# Patient Record
Sex: Male | Born: 1995
Health system: Southern US, Community
[De-identification: ages and names within clinical notes are randomized; demographics above are authoritative.]

## PROBLEM LIST (undated history)

## (undated) DIAGNOSIS — Z889 Allergy status to unspecified drugs, medicaments and biological substances status: Secondary | ICD-10-CM

## (undated) DIAGNOSIS — Z86711 Personal history of pulmonary embolism: Secondary | ICD-10-CM

## (undated) DIAGNOSIS — I38 Endocarditis, valve unspecified: Secondary | ICD-10-CM

## (undated) DIAGNOSIS — F1990 Other psychoactive substance use, unspecified, uncomplicated: Secondary | ICD-10-CM

## (undated) HISTORY — PX: TRICUSPID VALVE REPLACEMENT: SHX816

## (undated) HISTORY — PX: FOOT SURGERY: SHX648

---
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
## 2016-01-04 ENCOUNTER — Emergency Department (HOSPITAL_BASED_OUTPATIENT_CLINIC_OR_DEPARTMENT_OTHER)
Admission: EM | Admit: 2016-01-04 | Discharge: 2016-01-04 | Disposition: A | Discharge status: Home / Self Care | Payer: Self-pay | Attending: Emergency Medicine | Admitting: Emergency Medicine

## 2016-01-04 ENCOUNTER — Encounter (HOSPITAL_BASED_OUTPATIENT_CLINIC_OR_DEPARTMENT_OTHER): Payer: Self-pay | Admitting: *Deleted

## 2016-01-04 ENCOUNTER — Emergency Department (HOSPITAL_BASED_OUTPATIENT_CLINIC_OR_DEPARTMENT_OTHER): Payer: Self-pay

## 2016-01-04 DIAGNOSIS — Y9389 Activity, other specified: Secondary | ICD-10-CM | POA: Insufficient documentation

## 2016-01-04 DIAGNOSIS — S8992XA Unspecified injury of left lower leg, initial encounter: Secondary | ICD-10-CM | POA: Insufficient documentation

## 2016-01-04 DIAGNOSIS — W010XXA Fall on same level from slipping, tripping and stumbling without subsequent striking against object, initial encounter: Secondary | ICD-10-CM | POA: Insufficient documentation

## 2016-01-04 DIAGNOSIS — Y9289 Other specified places as the place of occurrence of the external cause: Secondary | ICD-10-CM | POA: Insufficient documentation

## 2016-01-04 DIAGNOSIS — F17210 Nicotine dependence, cigarettes, uncomplicated: Secondary | ICD-10-CM | POA: Insufficient documentation

## 2016-01-04 DIAGNOSIS — Y998 Other external cause status: Secondary | ICD-10-CM | POA: Insufficient documentation

## 2016-01-04 DIAGNOSIS — M25562 Pain in left knee: Secondary | ICD-10-CM

## 2016-01-04 HISTORY — DX: Allergy status to unspecified drugs, medicaments and biological substances: Z88.9

## 2016-01-04 MED ORDER — IBUPROFEN 600 MG PO TABS: 600.0000 mg | ORAL_TABLET | Freq: Four times a day (QID) | ORAL | Status: DC | PRN

## 2016-01-04 MED ORDER — IBUPROFEN 400 MG PO TABS
600.0000 mg | ORAL_TABLET | Freq: Once | ORAL | Status: AC
  Administered 2016-01-04: 600 mg via ORAL
  Filled 2016-01-04: qty 1

## 2016-01-04 MED ORDER — HYDROCODONE-ACETAMINOPHEN 5-325 MG PO TABS: 2.0000 | ORAL_TABLET | Freq: Once | ORAL | Status: DC

## 2016-01-04 MED ORDER — OXYCODONE-ACETAMINOPHEN 5-325 MG PO TABS
1.0000 | ORAL_TABLET | Freq: Once | ORAL | Status: AC
  Administered 2016-01-04: 1 via ORAL
  Filled 2016-01-04: qty 1

## 2016-01-04 MED ORDER — HYDROCODONE-ACETAMINOPHEN 5-325 MG PO TABS: 2.0000 | ORAL_TABLET | ORAL | Status: DC | PRN

## 2016-01-04 NOTE — ED Notes (Signed)
Left knee pain, tripped and fell yesterday and landed on brick- Ambulates with pain

## 2016-01-04 NOTE — ED Notes (Signed)
Patient transported to X-ray 

## 2016-01-04 NOTE — ED Notes (Signed)
Family at bedside. 

## 2016-01-04 NOTE — Discharge Instructions (Signed)
Knee Pain Follow up with orthopedics if you continue to have pain after one week. Take ibuprofen every 6 hours as needed for pain. Take Norco for breakthrough pain. Knee pain is a common problem. It can have many causes. The pain often goes away by following your doctor's home care instructions. Treatment for ongoing pain will depend on the cause of your pain. If your knee pain continues, more tests may be needed to diagnose your condition. Tests may include X-rays or other imaging studies of your knee. HOME CARE  Take medicines only as told by your doctor.  Rest your knee and keep it raised (elevated) while you are resting.  Do not do things that cause pain or make your pain worse.  Avoid activities where both feet leave the ground at the same time, such as running, jumping rope, or doing jumping jacks.  Apply ice to the knee area:  Put ice in a plastic bag.  Place a towel between your skin and the bag.  Leave the ice on for 20 minutes, 2-3 times a day.  Ask your doctor if you should wear an elastic knee support.  Sleep with a pillow under your knee.  Lose weight if you are overweight. Being overweight can make your knee hurt more.  Do not use any tobacco products, including cigarettes, chewing tobacco, or electronic cigarettes. If you need help quitting, ask your doctor. Smoking may slow the healing of any bone and joint problems that you may have. GET HELP IF:  Your knee pain does not stop, it changes, or it gets worse.  You have a fever along with knee pain.  Your knee gives out or locks up.  Your knee becomes more swollen. GET HELP RIGHT AWAY IF:   Your knee feels hot to the touch.  You have chest pain or trouble breathing.   This information is not intended to replace advice given to you by your health care provider. Make sure you discuss any questions you have with your health care provider.   Document Released: 01/28/2009 Document Revised: 11/22/2014 Document  Reviewed: 01/02/2014 Elsevier Interactive Patient Education [employer]

## 2016-01-04 NOTE — ED Provider Notes (Signed)
CSN: [id number]     Arrival date & time 01/04/16  1043 History   First MD Initiated Contact with Patient 01/04/16 1156     Chief Complaint  Patient presents with  . Knee Pain   (Consider location/radiation/quality/duration/timing/severity/associated sxs/prior Treatment) Patient is a 20 y.o. male presenting with knee pain. The history is provided by the patient. No language interpreter was used.  Knee Pain   Mr. Ralph Harris is a 20 y.o male with no significant past medical history who presents for left knee pain after tripping and falling yesterday. She states she landed on a brick. She was able to ambulate afterwards. He reports not being able to sleep last night after taking 2 ibuprofen. States the pain has worsened and is throbbing. Denies any numbness or tingling. Denies any head injury or loss of consciousness.  Past Medical History  Diagnosis Date  . Multiple allergies    Past Surgical History  Procedure Laterality Date  . Foot surgery     No family history on file. Social History  Substance Use Topics  . Smoking status: Current Every Day Smoker    Types: Cigarettes  . Smokeless tobacco: Former Neurosurgeon  . Alcohol Use: No    Review of Systems  Musculoskeletal: Positive for myalgias, arthralgias and gait problem.  Skin: Negative for color change and wound.  Neurological: Negative for weakness and numbness.      Allergies  Review of patient's allergies indicates no known allergies.  Home Medications   Prior to Admission medications   Medication Sig Start Date End Date Taking? Authorizing Provider  HYDROcodone-acetaminophen (NORCO/VICODIN) 5-325 MG tablet Take 2 tablets by mouth every 4 (four) hours as needed. 01/04/16   Hagen Bohorquez Patel-Mills, PA-C  ibuprofen (ADVIL,MOTRIN) 600 MG tablet Take 1 tablet (600 mg total) by mouth every 6 (six) hours as needed. 01/04/16   Rheya Minogue Patel-Mills, PA-C  phenol (CHLORASEPTIC) 1.4 % LIQD Use as directed 1 spray in the mouth or throat as needed for  throat irritation / pain. 02/04/15   Robyn M Hess, PA-C   BP 124/76 mmHg  Pulse 60  Temp(Src) 98.2 F (36.8 C) (Oral)  Resp 16  Wt 69.4 kg  SpO2 100% Physical Exam  Constitutional: He is oriented to person, place, and time. He appears well-developed and well-nourished.  HENT:  Head: Normocephalic and atraumatic.  Eyes: Conjunctivae are normal.  Neck: Normal range of motion. Neck supple.  Cardiovascular: Normal rate.   Pulmonary/Chest: Effort normal.  Musculoskeletal:  Left knee: Tenderness to the medial aspect of the knee. Able to flex to 45. Able to straight leg raise. 2+ DP pulse. No joint effusion. Mild erythema in the medial aspect of the knee. No posterior knee pain. No tenderness of the patella or fibular head. No tibial tuberosity pain.  Neurological: He is alert and oriented to person, place, and time.  Skin: Skin is warm and dry.  Nursing note and vitals reviewed.   ED Course  Procedures (including critical care time) Labs Review Labs Reviewed - No data to display  Imaging Review Dg Knee Complete 4 Views Left  01/04/2016  CLINICAL DATA:  Fall yesterday EXAM: LEFT KNEE - COMPLETE 4+ VIEW COMPARISON:  None. FINDINGS: There is no evidence of fracture, dislocation, or joint effusion. There is no evidence of arthropathy or other focal bone abnormality. Soft tissues are unremarkable. IMPRESSION: Negative. Electronically Signed   By: Jolaine Click M.D.   On: 01/04/2016 11:38   I have personally reviewed and evaluated these image results  as part of my medical decision-making.   EKG Interpretation None      MDM   Final diagnoses:  Knee pain, acute, left   Patient presents for left knee pain that started after trip and fall yesterday and landing on a brick. No signs of infection or joint effusion. Patient is able to walk and straight leg raise. Vitals stable. Patient was put in a sling. I discussed following up with orthopedics if he continued to have pain after one week.  He can take ibuprofen or Tylenol for pain. He was given a few Norco. Patient agrees the plan. Medications  oxyCODONE-acetaminophen (PERCOCET/ROXICET) 5-325 MG per tablet 1 tablet (1 tablet Oral Given 01/04/16 1141)  ibuprofen (ADVIL,MOTRIN) tablet 600 mg (600 mg Oral Given 01/04/16 1238)   Filed Vitals:   01/04/16 1108 01/04/16 1231  BP: 140/81 124/76  Pulse: 79 60  Temp: 98.2 F (36.8 C)   Resp: 18 [address] PA-C 01/04/16 1528  Lyndal Pulley, MD 01/05/16 1330

## 2016-08-21 ENCOUNTER — Emergency Department (HOSPITAL_BASED_OUTPATIENT_CLINIC_OR_DEPARTMENT_OTHER)
Admission: EM | Admit: 2016-08-21 | Discharge: 2016-08-21 | Disposition: A | Discharge status: Home / Self Care | Payer: Medicaid Other | Attending: Emergency Medicine | Admitting: Emergency Medicine

## 2016-08-21 ENCOUNTER — Encounter (HOSPITAL_BASED_OUTPATIENT_CLINIC_OR_DEPARTMENT_OTHER): Payer: Self-pay | Admitting: *Deleted

## 2016-08-21 DIAGNOSIS — F17210 Nicotine dependence, cigarettes, uncomplicated: Secondary | ICD-10-CM | POA: Insufficient documentation

## 2016-08-21 DIAGNOSIS — M791 Myalgia, unspecified site: Secondary | ICD-10-CM

## 2016-08-21 DIAGNOSIS — R748 Abnormal levels of other serum enzymes: Secondary | ICD-10-CM | POA: Insufficient documentation

## 2016-08-21 DIAGNOSIS — Z79899 Other long term (current) drug therapy: Secondary | ICD-10-CM | POA: Insufficient documentation

## 2016-08-21 LAB — CBC WITH DIFFERENTIAL/PLATELET
Basophils Absolute: 0 10*3/uL (ref 0.0–0.1)
Basophils Relative: 0 %
Eosinophils Absolute: 0.1 10*3/uL (ref 0.0–0.7)
Eosinophils Relative: 2 %
HCT: 50.6 % (ref 39.0–52.0)
Hemoglobin: 15.8 g/dL (ref 13.0–17.0)
Lymphocytes Relative: 25 %
Lymphs Abs: 1.6 10*3/uL (ref 0.7–4.0)
MCH: 30.4 pg (ref 26.0–34.0)
MCHC: 31.2 g/dL (ref 30.0–36.0)
MCV: 97.3 fL (ref 78.0–100.0)
Monocytes Absolute: 0.8 10*3/uL (ref 0.1–1.0)
Monocytes Relative: 14 %
Neutro Abs: 3.6 10*3/uL (ref 1.7–7.7)
Neutrophils Relative %: 59 %
Platelets: 159 10*3/uL (ref 150–400)
RBC: 5.2 MIL/uL (ref 4.22–5.81)
RDW: 13.2 % (ref 11.5–15.5)
WBC: 6.2 10*3/uL (ref 4.0–10.5)

## 2016-08-21 LAB — URINALYSIS, ROUTINE W REFLEX MICROSCOPIC
Bilirubin Urine: NEGATIVE
Glucose, UA: NEGATIVE mg/dL
Hgb urine dipstick: NEGATIVE
Ketones, ur: NEGATIVE mg/dL
Leukocytes, UA: NEGATIVE
Nitrite: NEGATIVE
Protein, ur: NEGATIVE mg/dL
Specific Gravity, Urine: 1.013 (ref 1.005–1.030)
pH: 6 (ref 5.0–8.0)

## 2016-08-21 LAB — BASIC METABOLIC PANEL
Anion gap: 8 (ref 5–15)
BUN: 19 mg/dL (ref 6–20)
CO2: 28 mmol/L (ref 22–32)
Calcium: 9.8 mg/dL (ref 8.9–10.3)
Chloride: 103 mmol/L (ref 101–111)
Creatinine, Ser: 1.25 mg/dL — ABNORMAL HIGH (ref 0.61–1.24)
GFR calc Af Amer: >60 mL/min (ref >60)
GFR calc non Af Amer: >60 mL/min (ref >60)
Glucose, Bld: 98 mg/dL (ref 65–99)
Potassium: 3.6 mmol/L (ref 3.5–5.1)
Sodium: 139 mmol/L (ref 135–145)

## 2016-08-21 LAB — CK: Total CK: 1379 U/L — ABNORMAL HIGH (ref 49–397)

## 2016-08-21 MED ORDER — HYDROCODONE-ACETAMINOPHEN 5-325 MG PO TABS: 1.0000 | ORAL_TABLET | Freq: Four times a day (QID) | ORAL | 0 refills | Status: DC | PRN

## 2016-08-21 MED ORDER — SODIUM CHLORIDE 0.9 % IV BOLUS (SEPSIS)
1000.0000 mL | Freq: Once | INTRAVENOUS | Status: AC
  Administered 2016-08-21: 1000 mL via INTRAVENOUS

## 2016-08-21 MED ORDER — FENTANYL CITRATE (PF) 100 MCG/2ML IJ SOLN
100.0000 ug | Freq: Once | INTRAMUSCULAR | Status: AC
  Administered 2016-08-21: 100 ug via INTRAVENOUS
  Filled 2016-08-21: qty 2

## 2016-08-21 MED ORDER — KETOROLAC TROMETHAMINE 30 MG/ML IJ SOLN
30.0000 mg | Freq: Once | INTRAMUSCULAR | Status: AC
  Administered 2016-08-21: 30 mg via INTRAVENOUS
  Filled 2016-08-21: qty 1

## 2016-08-21 NOTE — Discharge Instructions (Signed)
Return here tomorrow for recheck of your CK and renal function.  Increase your fluid intake and rest as much as possible

## 2016-08-21 NOTE — ED Notes (Signed)
PA aware of c/o of back pain. Mother at bedside

## 2016-08-21 NOTE — ED Triage Notes (Addendum)
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 Reports he woke up today with generalized body aches, particularly in lower back. Denies fever and diarrhea; reports nausea and vomiting. Denies sob, swelling of throat/tongue/lips.

## 2016-08-22 ENCOUNTER — Emergency Department (HOSPITAL_BASED_OUTPATIENT_CLINIC_OR_DEPARTMENT_OTHER): Payer: Medicaid Other

## 2016-08-22 ENCOUNTER — Emergency Department (HOSPITAL_BASED_OUTPATIENT_CLINIC_OR_DEPARTMENT_OTHER)
Admission: EM | Admit: 2016-08-22 | Discharge: 2016-08-22 | Disposition: A | Discharge status: Home / Self Care | Payer: Medicaid Other | Attending: Emergency Medicine | Admitting: Emergency Medicine

## 2016-08-22 ENCOUNTER — Encounter (HOSPITAL_BASED_OUTPATIENT_CLINIC_OR_DEPARTMENT_OTHER): Payer: Self-pay | Admitting: Emergency Medicine

## 2016-08-22 DIAGNOSIS — R109 Unspecified abdominal pain: Secondary | ICD-10-CM | POA: Insufficient documentation

## 2016-08-22 DIAGNOSIS — F17210 Nicotine dependence, cigarettes, uncomplicated: Secondary | ICD-10-CM | POA: Insufficient documentation

## 2016-08-22 DIAGNOSIS — Z79899 Other long term (current) drug therapy: Secondary | ICD-10-CM | POA: Insufficient documentation

## 2016-08-22 DIAGNOSIS — B349 Viral infection, unspecified: Secondary | ICD-10-CM | POA: Insufficient documentation

## 2016-08-22 DIAGNOSIS — R748 Abnormal levels of other serum enzymes: Secondary | ICD-10-CM

## 2016-08-22 LAB — URINALYSIS, ROUTINE W REFLEX MICROSCOPIC
Bilirubin Urine: NEGATIVE
Glucose, UA: NEGATIVE mg/dL
Hgb urine dipstick: NEGATIVE
Ketones, ur: NEGATIVE mg/dL
Leukocytes, UA: NEGATIVE
Nitrite: NEGATIVE
Protein, ur: NEGATIVE mg/dL
Specific Gravity, Urine: 1.009 (ref 1.005–1.030)
pH: 6.5 (ref 5.0–8.0)

## 2016-08-22 LAB — BASIC METABOLIC PANEL
Anion gap: 5 (ref 5–15)
BUN: 11 mg/dL (ref 6–20)
CO2: 29 mmol/L (ref 22–32)
Calcium: 9.1 mg/dL (ref 8.9–10.3)
Chloride: 101 mmol/L (ref 101–111)
Creatinine, Ser: 1.02 mg/dL (ref 0.61–1.24)
GFR calc Af Amer: >60 mL/min (ref >60)
GFR calc non Af Amer: >60 mL/min (ref >60)
Glucose, Bld: 83 mg/dL (ref 65–99)
Potassium: 3.7 mmol/L (ref 3.5–5.1)
Sodium: 135 mmol/L (ref 135–145)

## 2016-08-22 LAB — CK: Total CK: 816 U/L — ABNORMAL HIGH (ref 49–397)

## 2016-08-22 LAB — RPR: RPR Ser Ql: NONREACTIVE

## 2016-08-22 MED ORDER — IBUPROFEN 800 MG PO TABS: 800.0000 mg | ORAL_TABLET | Freq: Three times a day (TID) | ORAL | 0 refills | Status: AC | PRN

## 2016-08-22 MED ORDER — MORPHINE SULFATE (PF) 4 MG/ML IV SOLN
4.0000 mg | Freq: Once | INTRAVENOUS | Status: AC
  Administered 2016-08-22: 4 mg via INTRAVENOUS
  Filled 2016-08-22: qty 1

## 2016-08-22 MED ORDER — SODIUM CHLORIDE 0.9 % IV BOLUS (SEPSIS)
2000.0000 mL | Freq: Once | INTRAVENOUS | Status: AC
  Administered 2016-08-22: 2000 mL via INTRAVENOUS

## 2016-08-22 NOTE — ED Provider Notes (Signed)
[facility] Provider Note   CSN: [id number] Arrival date & time: 08/21/16  0901     History   Chief Complaint Chief Complaint  Patient presents with  . Urticaria  . Generalized Body Aches    HPI Ralph Harris is a 20 y.o. male.  HPI Patient presents to the emergency department with body aches and rash.  Patient states that he just feels generally weak but has no other symptoms.  Patient states that his back and sides are hurting him. The patient denies chest pain, shortness of breath, headache,blurred vision, neck pain, fever, cough, weakness, numbness, dizziness, anorexia, edema, abdominal pain,  diarrhea, , dysuria, hematemesis, bloody stool, near syncope, or syncope.  Patient states that nothing seems to make the condition better, movement and palpation of the areas that are sore.  Make the pain worse Past Medical History:  Diagnosis Date  . Multiple allergies     There are no active problems to display for this patient.   Past Surgical History:  Procedure Laterality Date  . FOOT SURGERY         Home Medications    Prior to Admission medications   Medication Sig Start Date End Date Taking? Authorizing Provider  HYDROcodone-acetaminophen (NORCO/VICODIN) 5-325 MG tablet Take 1 tablet by mouth every 6 (six) hours as needed for moderate pain. 08/21/16   Charlestine Nighthristopher Sanyla Summey, PA-C  ibuprofen (ADVIL,MOTRIN) 800 MG tablet Take 1 tablet (800 mg total) by mouth every 8 (eight) hours as needed. 08/22/16   Deidrick Rainey, PA-C  phenol (CHLORASEPTIC) 1.4 % LIQD Use as directed 1 spray in the mouth or throat as needed for throat irritation / pain. 02/04/15   Kathrynn Speedobyn M Hess, PA-C    Family History No family history on file.  Social History Social History  Substance Use Topics  . Smoking status: Current Every Day Smoker    Types: Cigarettes  . Smokeless tobacco: Former NeurosurgeonUser  . Alcohol use No     Allergies   Review of patient's allergies indicates no known  allergies.   Review of Systems Review of Systems All other systems negative except as documented in the HPI. All pertinent positives and negatives as reviewed in the HPI.  Physical Exam Updated Vital Signs BP 123/86 (BP Location: Right Arm)   Pulse 82   Temp 98.7 F (37.1 C) (Oral)   Resp 16   Ht 5\' 10"  (1.778 m)   Wt 65.8 kg   SpO2 99%   BMI 20.81 kg/m   Physical Exam  Constitutional: He is oriented to person, place, and time. He appears well-developed and well-nourished. No distress.  HENT:  Head: Normocephalic and atraumatic.  Mouth/Throat: Oropharynx is clear and moist.  Eyes: Pupils are equal, round, and reactive to light.  Neck: Normal range of motion. Neck supple.  Cardiovascular: Normal rate, regular rhythm and normal heart sounds.  Exam reveals no gallop and no friction rub.   No murmur heard. Pulmonary/Chest: Effort normal and breath sounds normal. No respiratory distress.  Abdominal: Soft. Bowel sounds are normal. He exhibits no distension and no mass. There is tenderness. There is no guarding.  Musculoskeletal: He exhibits no edema.  Neurological: He is alert and oriented to person, place, and time. He exhibits normal muscle tone. Coordination normal.  Skin: Skin is warm and dry. Capillary refill takes less than 2 seconds. No rash noted. No erythema.  Psychiatric: He has a normal mood and affect. His behavior is normal.  Nursing note and vitals reviewed.  ED Treatments / Results  Labs (all labs ordered are listed, but only abnormal results are displayed) Labs Reviewed  BASIC METABOLIC PANEL - Abnormal; Notable for the following:       Result Value   Creatinine, Ser 1.25 (*)    All other components within normal limits  CK - Abnormal; Notable for the following:    Total CK 1,379 (*)    All other components within normal limits  CBC WITH DIFFERENTIAL/PLATELET  URINALYSIS, ROUTINE W REFLEX MICROSCOPIC (NOT AT [facility])  RPR  ROCKY MTN SPOTTED FVR ABS  PNL(IGG+IGM)  GC/CHLAMYDIA PROBE AMP ([place]) NOT AT [facility]    EKG  EKG Interpretation None       Radiology Ct Renal Faulstich Study  Result Date: 08/22/2016 CLINICAL DATA:  Pt reports lower back pain, was here yesterday after breaking out in hives, here today for abnormal labs, CK was in the 1300's yesterday, 800's todayNausea, no vomiting or diarrhea, denies hematuria, no previous hx renal stones,NKI EXAM: CT ABDOMEN AND PELVIS WITHOUT CONTRAST TECHNIQUE: Multidetector CT imaging of the abdomen and pelvis was performed following the standard protocol without IV contrast. COMPARISON:  10/24/2006 FINDINGS: Lower chest: Clear lung bases.  Heart normal in size. Hepatobiliary: No focal liver abnormality is seen. No gallstones, gallbladder wall thickening, or biliary dilatation. Pancreas: Unremarkable. No pancreatic ductal dilatation or surrounding inflammatory changes. Spleen: Normal in size without focal abnormality. Adrenals/Urinary Tract: Adrenal glands are unremarkable. Kidneys are normal, without renal calculi, focal lesion, or hydronephrosis. Bladder is unremarkable. Stomach/Bowel: Stomach is within normal limits. Appendix appears normal. No evidence of bowel wall thickening, distention, or inflammatory changes. Vascular/Lymphatic: No significant vascular findings are present. No enlarged abdominal or pelvic lymph nodes. Reproductive: Prostate is unremarkable. Other: No abdominal wall hernia or abnormality. No abdominopelvic ascites. Musculoskeletal: No acute or significant osseous findings. IMPRESSION: Normal unenhanced CT scan of the abdomen pelvis. Electronically Signed   By: Amie Portland M.D.   On: 08/22/2016 12:02    Procedures Procedures (including critical care time)  Medications Ordered in ED Medications  sodium chloride 0.9 % bolus 1,000 mL (0 mLs Intravenous Stopped 08/21/16 1112)  ketorolac (TORADOL) 30 MG/ML injection 30 mg (30 mg Intravenous Given 08/21/16 1029)  sodium chloride  0.9 % bolus 1,000 mL (0 mLs Intravenous Stopped 08/21/16 1414)  fentaNYL (SUBLIMAZE) injection 100 mcg (100 mcg Intravenous Given 08/21/16 1537)     Initial Impression / Assessment and Plan / ED Course  I have reviewed the triage vital signs and the nursing notes.  Pertinent labs & imaging results that were available during my care of the patient were reviewed by me and considered in my medical decision making (see chart for details).  Clinical Course    Patient is found to have an elevated CK and slightly elevated creatinine.  Patient given 2 L of IV fluid and the rest of this testing here today has been normal.  The patient has no upper respiratory symptoms.  Patient states that he did exert himself fairly happily with his mother complaining out a house.  Patient states that he is feeling some better following the fluids and the IV medications.  I advised patient to return tomorrow for reevaluation of his creatinine, along with his CK level.  Patient agrees the plan and all questions were answered Final Clinical Impressions(s) / ED Diagnoses   Final diagnoses:  Elevated CK  Myalgia    New Prescriptions Discharge Medication List as of 08/21/2016  3:54 PM  Charlestine Night, PA-C 08/22/16 1706    Pricilla Loveless, MD 08/27/16 1750

## 2016-08-22 NOTE — ED Provider Notes (Signed)
[facility]   CSN: [id number] Arrival date & time: 08/22/16  1000     History   Chief Complaint Chief Complaint  Patient presents with  . Labs Only    HPI Ralph Harris is a 20 y.o. male.  HPI Patient presents to the emergency department .  Reevaluation from his visit yesterday.  The patient is feeling some better and does appear to be more comfortable today.  Patient states that he still having back discomfort along with bilateral side pain.  He states his abdomen still is uncomfortable as well.  Overall, the patient does state he has slightly improvedThe patient denies chest pain, shortness of breath, headache,blurred vision, neck pain, fever, cough, weakness, numbness, dizziness, anorexia, edema,  nausea, vomiting, diarrhea, rash, dysuria, hematemesis, bloody stool, near syncope, or syncope. Past Medical History:  Diagnosis Date  . Multiple allergies     There are no active problems to display for this patient.   Past Surgical History:  Procedure Laterality Date  . FOOT SURGERY         Home Medications    Prior to Admission medications   Medication Sig Start Date End Date Taking? Authorizing Provider  HYDROcodone-acetaminophen (NORCO/VICODIN) 5-325 MG tablet Take 1 tablet by mouth every 6 (six) hours as needed for moderate pain. 08/21/16  Yes Walton Digilio, PA-C  ibuprofen (ADVIL,MOTRIN) 800 MG tablet Take 1 tablet (800 mg total) by mouth every 8 (eight) hours as needed. 08/22/16   Val Farnam, PA-C  phenol (CHLORASEPTIC) 1.4 % LIQD Use as directed 1 spray in the mouth or throat as needed for throat irritation / pain. 02/04/15   Kathrynn Speed, PA-C    Family History No family history on file.  Social History Social History  Substance Use Topics  . Smoking status: Current Every Day Smoker    Types: Cigarettes  . Smokeless tobacco: Former Neurosurgeon  . Alcohol use No     Allergies   Review of patient's allergies indicates no  known allergies.   Review of Systems Review of Systems All other systems negative except as documented in the HPI. All pertinent positives and negatives as reviewed in the HPI.  Physical Exam Updated Vital Signs BP 147/91 (BP Location: Right Arm)   Pulse 81   Temp 98.7 F (37.1 C) (Oral)   Resp 16   Ht 5\' 10"  (1.778 m)   Wt 65.8 kg   SpO2 99%   BMI 20.81 kg/m   Physical Exam  Constitutional: He is oriented to person, place, and time. He appears well-developed and well-nourished. No distress.  HENT:  Head: Normocephalic and atraumatic.  Mouth/Throat: Oropharynx is clear and moist.  Eyes: Pupils are equal, round, and reactive to light.  Neck: Normal range of motion. Neck supple.  Cardiovascular: Normal rate, regular rhythm and normal heart sounds.  Exam reveals no gallop and no friction rub.   No murmur heard. Pulmonary/Chest: Effort normal and breath sounds normal. No respiratory distress. He has no wheezes.  Abdominal: Soft. Bowel sounds are normal. He exhibits no distension and no mass. There is tenderness. There is no rebound and no guarding.  Neurological: He is alert and oriented to person, place, and time. He exhibits normal muscle tone. Coordination normal.  Skin: Skin is warm and dry. No rash noted. No erythema.  Psychiatric: He has a normal mood and affect. His behavior is normal.  Nursing note and vitals reviewed.    ED Treatments / Results  Labs (all  labs ordered are listed, but only abnormal results are displayed) Labs Reviewed  CK - Abnormal; Notable for the following:       Result Value   Total CK 816 (*)    All other components within normal limits  BASIC METABOLIC PANEL  URINALYSIS, ROUTINE W RE[facility]LEX [facility]COPIC [facility]  B. BURGDORFI ANTIBODIES    EKG  EKG Interpretation None       Radiology Ct Renal Carsten Study  Result Date: 08/22/2016 CLINICAL DATA:  Pt reports lower back pain, was here yesterday after breaking out in hives, here  today for abnormal labs, CK was in the 1300's yesterday, 800's todayNausea, no vomiting or diarrhea, denies hematuria, no previous hx renal stones,NKI EXAM: CT ABDOMEN AND PELVIS WITHOUT CONTRAST TECHNIQUE: Multidetector CT imaging of the abdomen and pelvis was performed following the standard protocol without IV contrast. COMPARISON:  10/24/2006 FINDINGS: Lower chest: Clear lung bases.  Heart normal in size. Hepatobiliary: No focal liver abnormality is seen. No gallstones, gallbladder wall thickening, or biliary dilatation. Pancreas: Unremarkable. No pancreatic ductal dilatation or surrounding inflammatory changes. Spleen: Normal in size without focal abnormality. Adrenals/Urinary Tract: Adrenal glands are unremarkable. Kidneys are normal, without renal calculi, focal lesion, or hydronephrosis. Bladder is unremarkable. Stomach/Bowel: Stomach is within normal limits. Appendix appears normal. No evidence of bowel wall thickening, distention, or inflammatory changes. Vascular/Lymphatic: No significant vascular findings are present. No enlarged abdominal or pelvic lymph nodes. Reproductive: Prostate is unremarkable. Other: No abdominal wall hernia or abnormality. No abdominopelvic ascites. Musculoskeletal: No acute or significant osseous findings. IMPRESSION: Normal unenhanced CT scan of the abdomen pelvis. Electronically Signed   By: Amie Portlandavid  Ormond M.D.   On: 08/22/2016 12:02    Procedures Procedures (including critical care time)  Medications Ordered in ED Medications  sodium chloride 0.9 % bolus 2,000 mL (0 mLs Intravenous Stopped 08/22/16 1409)  morphine 4 MG/ML injection 4 mg (4 mg Intravenous Given 08/22/16 1301)     Initial Impression / Assessment and Plan / ED Course  I have reviewed the triage vital signs and the nursing notes.  Pertinent labs & imaging results that were available during my care of the patient were reviewed by me and considered in my medical decision making (see chart for  details).  Clinical Course    The patient's labs have improved.  He was given IV fluids here and also advised to follow-up with his primary care doctor were still awaiting some lab results from yesterday.  I advised him to return here as needed.  The patient is well appearing on examination.  Told him that I am not exactly sure what is the cause of his symptoms, but we feel better that his vital signs have remained normal, along with the fact that his laboratory testing has improved  Final Clinical Impressions(s) / ED Diagnoses   Final diagnoses:  Elevated CK  Viral illness    New Prescriptions Discharge Medication List as of 08/22/2016  1:53 PM       Charlestine Nighthristopher Tyrice Hewitt, PA-C 08/22/16 1727    Linwood DibblesJon Knapp, MD 08/23/16 [phone]

## 2016-08-22 NOTE — ED Triage Notes (Signed)
Return visit to ED for lab draw of Ck, elevated yesterday at 1,300 ish, continues to have lower back pain, drinking fluids well.

## 2016-08-22 NOTE — Discharge Instructions (Signed)
Return here as needed. Follow up with your primary care. Increase your fluid intake

## 2016-08-23 LAB — GC/CHLAMYDIA PROBE AMP (~~LOC~~) NOT AT ARMC
Chlamydia: NEGATIVE
Neisseria Gonorrhea: NEGATIVE

## 2016-08-24 LAB — RMSF, IGG, IFA: RMSF, IGG, IFA: 1:64 {titer} — ABNORMAL HIGH

## 2016-08-24 LAB — ROCKY MTN SPOTTED FVR ABS PNL(IGG+IGM)
RMSF IgG: POSITIVE — AB
RMSF IgM: 0.68 index (ref 0.00–0.89)

## 2016-08-24 LAB — B. BURGDORFI ANTIBODIES: B burgdorferi Ab IgG+IgM: <0.91 {ISR} (ref 0.00–0.90)

## 2016-08-25 ENCOUNTER — Telehealth (HOSPITAL_BASED_OUTPATIENT_CLINIC_OR_DEPARTMENT_OTHER): Payer: Self-pay | Admitting: Emergency Medicine

## 2016-12-07 IMAGING — CT CT RENAL STONE PROTOCOL
2 of 4 series · 16 of 46 positions shown, 18 images · non-contrast
Comparison: 10/24/2006

CLINICAL DATA: Pt reports lower back pain, was here yesterday after
breaking out in hives, here today for abnormal labs, CK was in the
hematuria, no previous hx renal stones,NKI

EXAM:
CT ABDOMEN AND PELVIS WITHOUT CONTRAST
TECHNIQUE: Multidetector CT imaging of the abdomen and pelvis was performed
following the standard protocol without IV contrast.

[Series 2: axial st · axial · 0.82mm/px · z∈[-540,-100]mm · 13 of 96 slices shown, 15 images]
[im 4/96  soft-tissue]
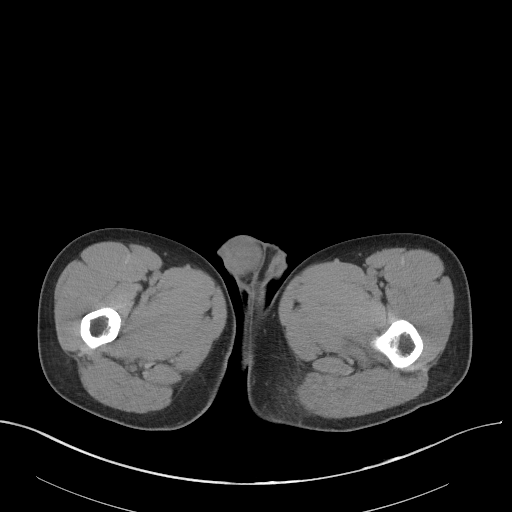
[im 4/96  bone]
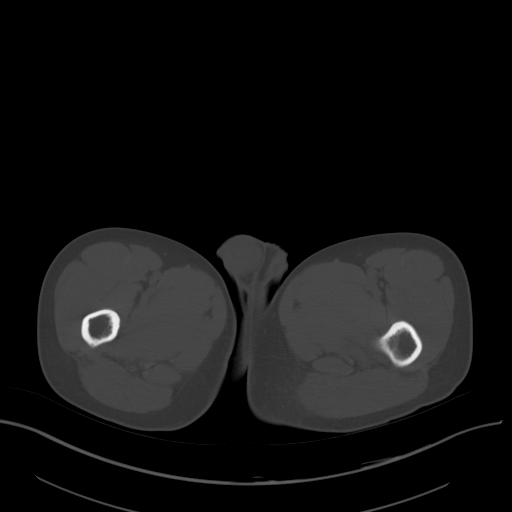
[im 12/96  soft-tissue]
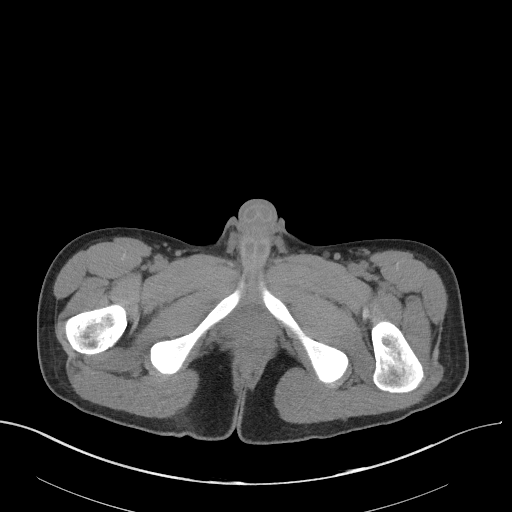
[im 20/96  soft-tissue]
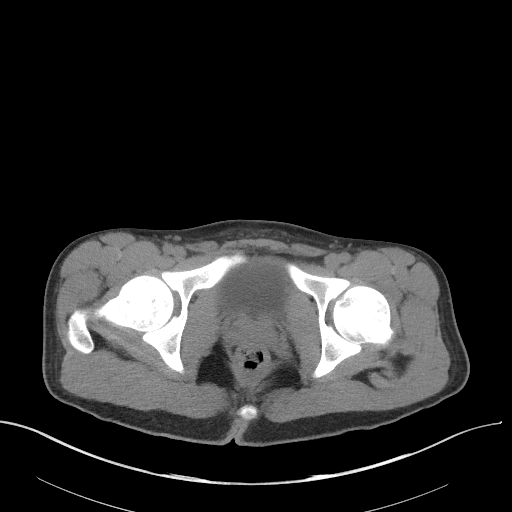
[im 27/96  soft-tissue]
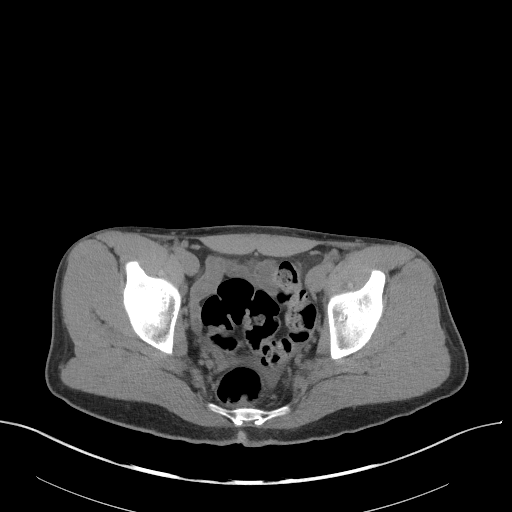
[im 35/96  soft-tissue]
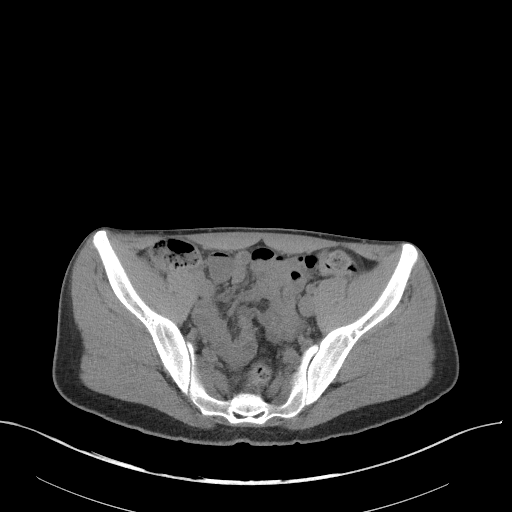
[im 42/96  soft-tissue]
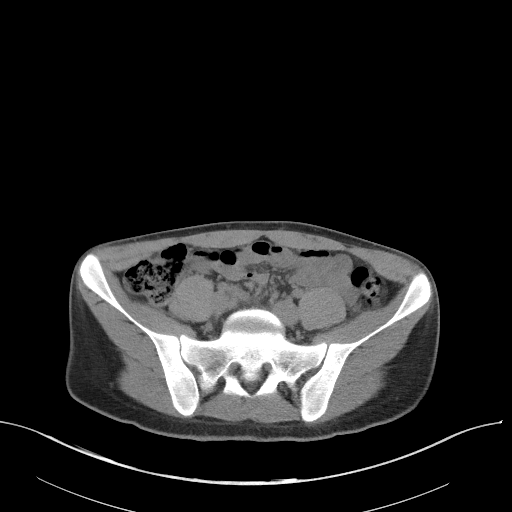
[im 50/96  soft-tissue]
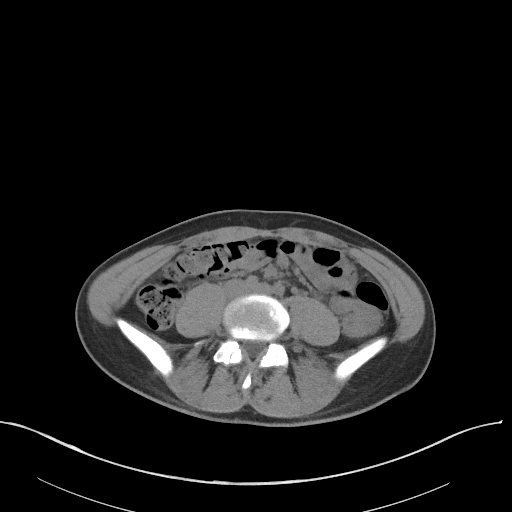
[im 54/96  soft-tissue]
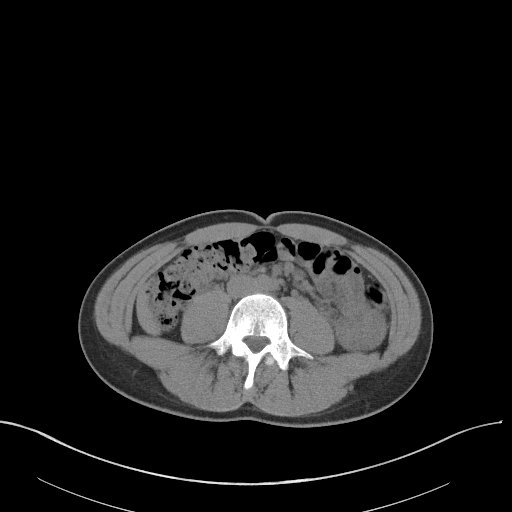
[im 61/96  soft-tissue]
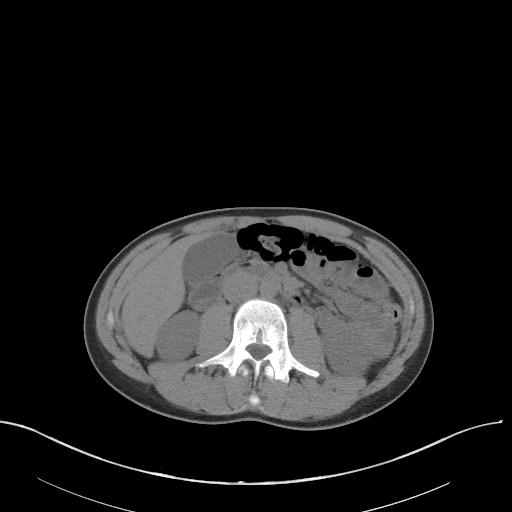
[im 61/96  bone]
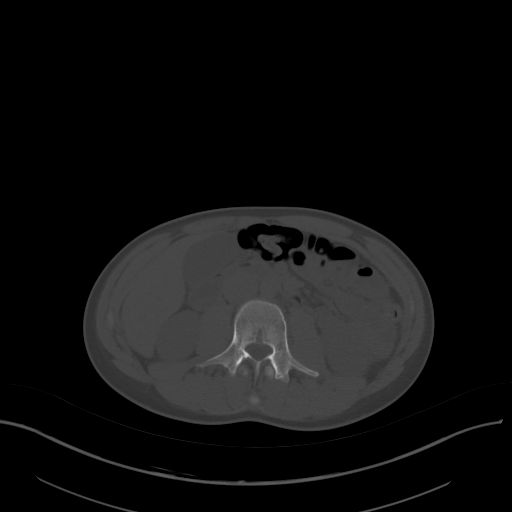
[im 69/96  soft-tissue]
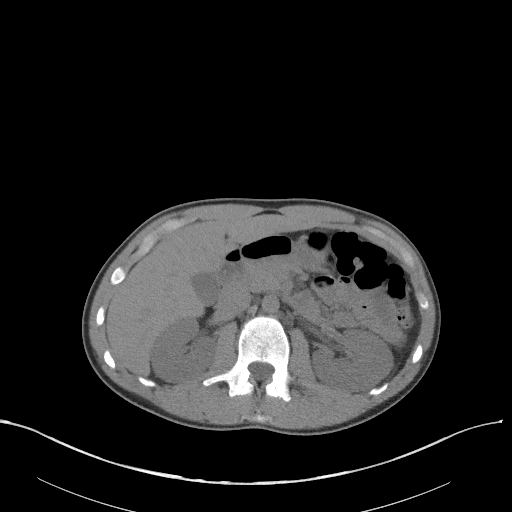
[im 77/96  soft-tissue]
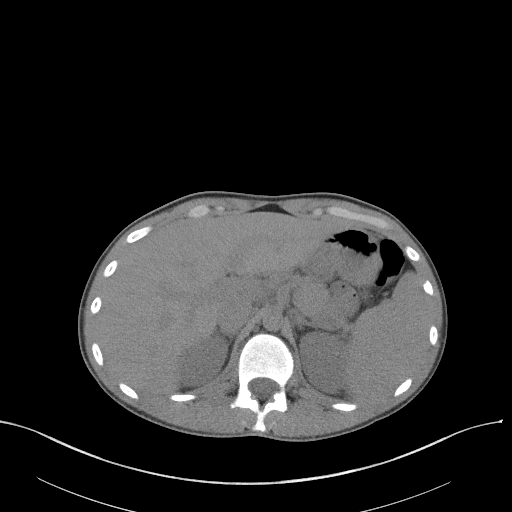
[im 84/96  soft-tissue]
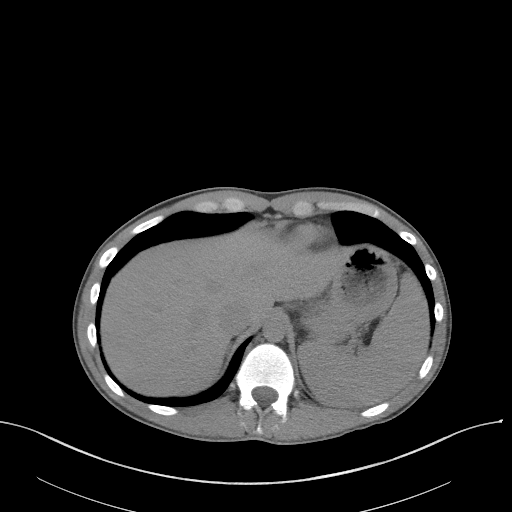
[im 92/96  soft-tissue]
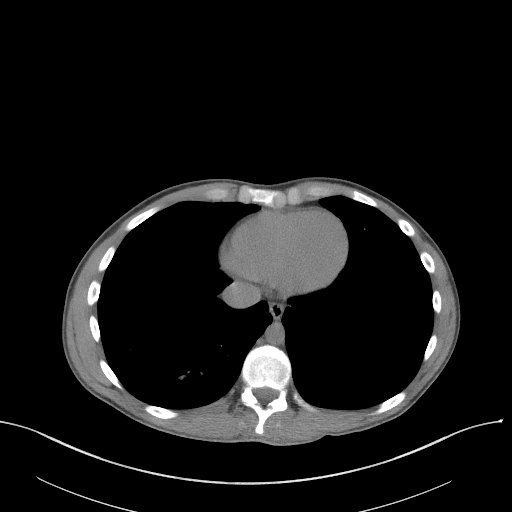

[Series 5: coronal st · coronal · 0.80mm/px · 3 of 67 slices shown]
[im 23/67  soft-tissue]
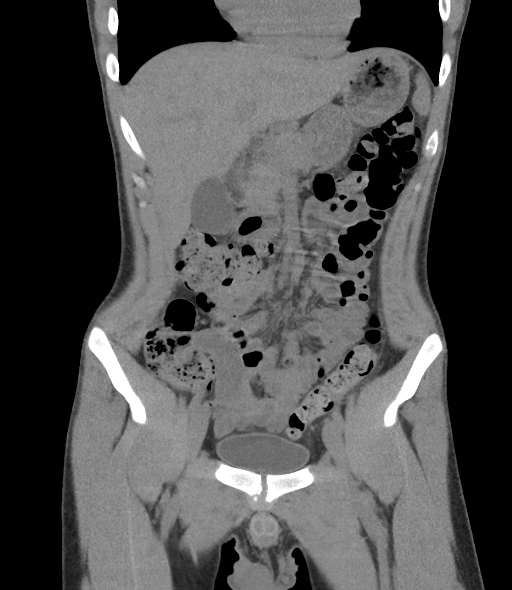
[im 30/67  soft-tissue]
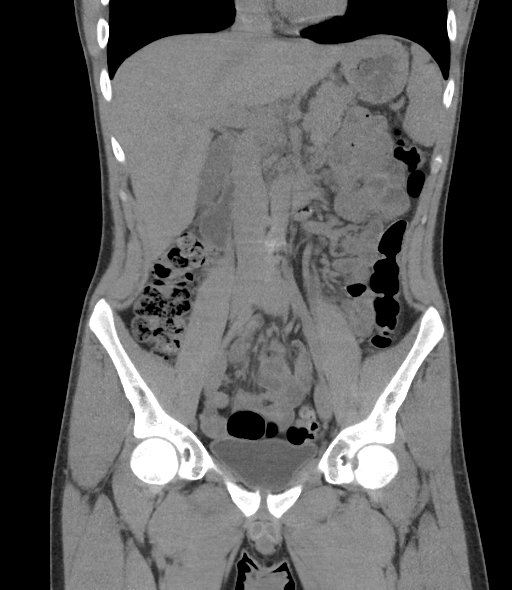
[im 37/67  soft-tissue]
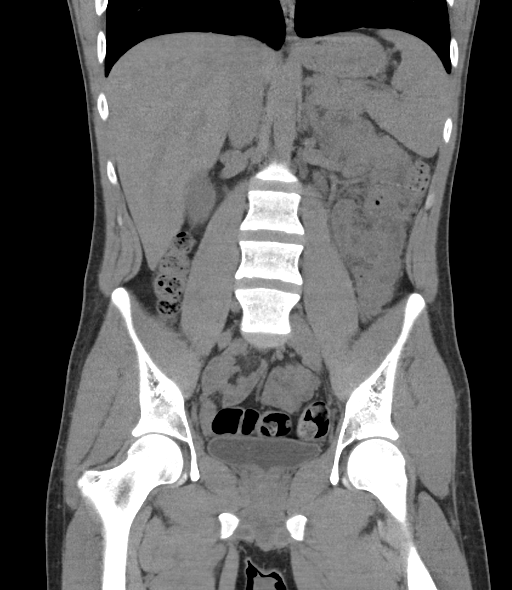

[16 of 46 positions shown; findings below may reference images not displayed]

FINDINGS: Lower chest: Clear lung bases.  Heart normal in size.

Hepatobiliary: No focal liver abnormality is seen. No gallstones,
gallbladder wall thickening, or biliary dilatation.

Pancreas: Unremarkable. No pancreatic ductal dilatation or
surrounding inflammatory changes.

Spleen: Normal in size without focal abnormality.

Adrenals/Urinary Tract: Adrenal glands are unremarkable. Kidneys are
normal, without renal calculi, focal lesion, or hydronephrosis.
Bladder is unremarkable.

Stomach/Bowel: Stomach is within normal limits. Appendix appears
normal. No evidence of bowel wall thickening, distention, or
inflammatory changes.

Vascular/Lymphatic: No significant vascular findings are present. No
enlarged abdominal or pelvic lymph nodes.

Reproductive: Prostate is unremarkable.

Other: No abdominal wall hernia or abnormality. No abdominopelvic
ascites.

Musculoskeletal: No acute or significant osseous findings.
IMPRESSION: Normal unenhanced CT scan of the abdomen pelvis.

## 2017-05-31 ENCOUNTER — Encounter (HOSPITAL_BASED_OUTPATIENT_CLINIC_OR_DEPARTMENT_OTHER): Payer: Self-pay | Admitting: *Deleted

## 2017-05-31 ENCOUNTER — Emergency Department (HOSPITAL_BASED_OUTPATIENT_CLINIC_OR_DEPARTMENT_OTHER): Payer: Self-pay

## 2017-05-31 ENCOUNTER — Emergency Department (HOSPITAL_BASED_OUTPATIENT_CLINIC_OR_DEPARTMENT_OTHER)
Admission: EM | Admit: 2017-05-31 | Discharge: 2017-05-31 | Disposition: A | Discharge status: Home / Self Care | Payer: Self-pay | Attending: Emergency Medicine | Admitting: Emergency Medicine

## 2017-05-31 DIAGNOSIS — Y999 Unspecified external cause status: Secondary | ICD-10-CM | POA: Insufficient documentation

## 2017-05-31 DIAGNOSIS — Y929 Unspecified place or not applicable: Secondary | ICD-10-CM | POA: Insufficient documentation

## 2017-05-31 DIAGNOSIS — Y9389 Activity, other specified: Secondary | ICD-10-CM | POA: Insufficient documentation

## 2017-05-31 DIAGNOSIS — S20212A Contusion of left front wall of thorax, initial encounter: Secondary | ICD-10-CM | POA: Insufficient documentation

## 2017-05-31 DIAGNOSIS — F17210 Nicotine dependence, cigarettes, uncomplicated: Secondary | ICD-10-CM | POA: Insufficient documentation

## 2017-05-31 DIAGNOSIS — W19XXXA Unspecified fall, initial encounter: Secondary | ICD-10-CM | POA: Insufficient documentation

## 2017-05-31 MED ORDER — TRAMADOL HCL 50 MG PO TABS: 50.0000 mg | ORAL_TABLET | Freq: Four times a day (QID) | ORAL | 0 refills | Status: DC | PRN

## 2017-05-31 MED FILL — traMADol HCL 50 MG TABS: 50 | 3 days supply | Qty: 15 | Fill #0

## 2017-05-31 NOTE — ED Provider Notes (Signed)
[facility]   CSN: [id number] Arrival date & time: 05/31/17  1019     History   Chief Complaint Chief Complaint  Patient presents with  . Fall    left rib pain    HPI Ralph Harris is a 21 y.o. male.  Patient is a 22 year old male with no significant past medical history presenting for evaluation of left rib pain. He reports that he fell approximately 10 days ago while helping his mother move furniture. He landed on the stairs on his left anterior ribs. He reports pain which has significantly worsened over the past 2-3 days in the absence of any new injury or trauma. He denies feeling short of breath but states that his pain is worse with breathing and palpation. He denies fevers, chills, or cough.   The history is provided by the patient.  Fall  This is a new problem. Episode onset: 10 days ago. The problem occurs constantly. The problem has been rapidly worsening. Associated symptoms include chest pain. Pertinent negatives include no shortness of breath. Exacerbated by: Deep breathing, movement, and palpation. Nothing relieves the symptoms. He has tried nothing for the symptoms.    Past Medical History:  Diagnosis Date  . Multiple allergies     There are no active problems to display for this patient.   Past Surgical History:  Procedure Laterality Date  . FOOT SURGERY         Home Medications    Prior to Admission medications   Medication Sig Start Date End Date Taking? Authorizing Provider  HYDROcodone-acetaminophen (NORCO/VICODIN) 5-325 MG tablet Take 1 tablet by mouth every 6 (six) hours as needed for moderate pain. 08/21/16   Lawyer, Cristal Deer, PA-C  ibuprofen (ADVIL,MOTRIN) 800 MG tablet Take 1 tablet (800 mg total) by mouth every 8 (eight) hours as needed. 08/22/16   Lawyer, Cristal Deer, PA-C  phenol (CHLORASEPTIC) 1.4 % LIQD Use as directed 1 spray in the mouth or throat as needed for throat irritation / pain. 02/04/15   Hess, Nada Boozer, PA-C    Family History No family history on file.  Social History Social History  Substance Use Topics  . Smoking status: Current Every Day Smoker    Types: Cigarettes  . Smokeless tobacco: Former Neurosurgeon  . Alcohol use No     Allergies   Patient has no known allergies.   Review of Systems Review of Systems  Respiratory: Negative for shortness of breath.   Cardiovascular: Positive for chest pain.  All other systems reviewed and are negative.    Physical Exam Updated Vital Signs BP (!) 152/89 (BP Location: Right Arm)   Pulse (!) 106   Temp 98.2 F (36.8 C) (Oral)   Resp (!) 28   Ht 5\' 11"  (1.803 m)   Wt 68 kg (150 lb)   SpO2 100%   BMI 20.92 kg/m   Physical Exam  Constitutional: He is oriented to person, place, and time. He appears well-developed and well-nourished. No distress.  HENT:  Head: Normocephalic and atraumatic.  Mouth/Throat: Oropharynx is clear and moist.  Neck: Normal range of motion. Neck supple.  Cardiovascular: Normal rate and regular rhythm.  Exam reveals no friction rub.   No murmur heard. Pulmonary/Chest: Effort normal and breath sounds normal. No respiratory distress. He has no wheezes. He has no rales. He exhibits tenderness.  There is tenderness to palpation over the left lower anterior ribs. There is no crepitus or palpable abnormality. Breath sounds are clear and  equal bilaterally.  Abdominal: Soft. Bowel sounds are normal. He exhibits no distension. There is no tenderness.  Musculoskeletal: Normal range of motion. He exhibits no edema.  Neurological: He is alert and oriented to person, place, and time. Coordination normal.  Skin: Skin is warm and dry. He is not diaphoretic.  Nursing note and vitals reviewed.    ED Treatments / Results  Labs (all labs ordered are listed, but only abnormal results are displayed) Labs Reviewed - No data to display  EKG  EKG Interpretation None       Radiology No results  found.  Procedures Procedures (including critical care time)  Medications Ordered in ED Medications - No data to display   Initial Impression / Assessment and Plan / ED Course  I have reviewed the triage vital signs and the nursing notes.  Pertinent labs & imaging results that were available during my care of the patient were reviewed by me and considered in my medical decision making (see chart for details).  X-rays are negative for fracture or pneumothorax. He will be discharged with tramadol, NSAIDs, and follow-up with primary Dr. if not improving.  Final Clinical Impressions(s) / ED Diagnoses   Final diagnoses:  None    New Prescriptions New Prescriptions   No medications on file     Geoffery Lyonselo, Brady Plant, MD 05/31/17 1122

## 2017-05-31 NOTE — ED Triage Notes (Signed)
Patient states he was going down step moving furniture one week ago, tripped and fell, landing on the steps.  States he has pain in the left ribs and a low grade fever of 100.0

## 2017-05-31 NOTE — Discharge Instructions (Signed)
Ibuprofen 600 mg every 6 hours as needed for pain.   Tramadol as prescribed as needed for pain not relieved with ibuprofen.  Return to the emergency department if you develop worsening pain, difficulty breathing, high fevers, or other new and concerning symptoms.

## 2017-06-03 ENCOUNTER — Encounter (HOSPITAL_BASED_OUTPATIENT_CLINIC_OR_DEPARTMENT_OTHER): Payer: Self-pay | Admitting: *Deleted

## 2017-06-03 ENCOUNTER — Emergency Department (HOSPITAL_BASED_OUTPATIENT_CLINIC_OR_DEPARTMENT_OTHER)
Admission: EM | Admit: 2017-06-03 | Discharge: 2017-06-03 | Disposition: A | Discharge status: Home / Self Care | Payer: Medicaid Other | Attending: Emergency Medicine | Admitting: Emergency Medicine

## 2017-06-03 DIAGNOSIS — F17210 Nicotine dependence, cigarettes, uncomplicated: Secondary | ICD-10-CM | POA: Insufficient documentation

## 2017-06-03 DIAGNOSIS — R509 Fever, unspecified: Secondary | ICD-10-CM | POA: Insufficient documentation

## 2017-06-03 LAB — BASIC METABOLIC PANEL
ANION GAP: 9 (ref 5–15)
BUN: 13 mg/dL (ref 6–20)
CO2: 28 mmol/L (ref 22–32)
Calcium: 8.7 mg/dL — ABNORMAL LOW (ref 8.9–10.3)
Chloride: 94 mmol/L — ABNORMAL LOW (ref 101–111)
Creatinine, Ser: 1.01 mg/dL (ref 0.61–1.24)
Glucose, Bld: 121 mg/dL — ABNORMAL HIGH (ref 65–99)
Potassium: 4.1 mmol/L (ref 3.5–5.1)
Sodium: 131 mmol/L — ABNORMAL LOW (ref 135–145)

## 2017-06-03 LAB — CBC WITH DIFFERENTIAL/PLATELET
BASOS ABS: 0 10*3/uL (ref 0.0–0.1)
BASOS PCT: 0 %
Eosinophils Absolute: 0.1 10*3/uL (ref 0.0–0.7)
Eosinophils Relative: 1 %
HEMATOCRIT: 35.3 % — AB (ref 39.0–52.0)
Hemoglobin: 12.5 g/dL — ABNORMAL LOW (ref 13.0–17.0)
Lymphocytes Relative: 8 %
Lymphs Abs: 0.6 10*3/uL — ABNORMAL LOW (ref 0.7–4.0)
MCH: 29.6 pg (ref 26.0–34.0)
MCHC: 35.4 g/dL (ref 30.0–36.0)
MCV: 83.5 fL (ref 78.0–100.0)
MONO ABS: 0.6 10*3/uL (ref 0.1–1.0)
Monocytes Relative: 8 %
NEUTROS ABS: 6.7 10*3/uL (ref 1.7–7.7)
NEUTROS PCT: 83 %
Platelets: 198 10*3/uL (ref 150–400)
RBC: 4.23 MIL/uL (ref 4.22–5.81)
RDW: 13.6 % (ref 11.5–15.5)
WBC: 8 10*3/uL (ref 4.0–10.5)

## 2017-06-03 LAB — URINALYSIS, ROUTINE W REFLEX MICROSCOPIC
Glucose, UA: NEGATIVE mg/dL
Hgb urine dipstick: NEGATIVE
KETONES UR: NEGATIVE mg/dL
Leukocytes, UA: NEGATIVE
NITRITE: NEGATIVE
PROTEIN: 30 mg/dL — AB
Specific Gravity, Urine: 1.021 (ref 1.005–1.030)
pH: 5.5 (ref 5.0–8.0)

## 2017-06-03 LAB — URINALYSIS, MICROSCOPIC (REFLEX)

## 2017-06-03 LAB — MONONUCLEOSIS SCREEN: Mono Screen: NEGATIVE

## 2017-06-03 MED ORDER — ACETAMINOPHEN 325 MG PO TABS
650.0000 mg | ORAL_TABLET | Freq: Once | ORAL | Status: AC | PRN
  Administered 2017-06-03: 650 mg via ORAL
  Filled 2017-06-03: qty 2

## 2017-06-03 MED ORDER — SODIUM CHLORIDE 0.9 % IV BOLUS (SEPSIS)
1000.0000 mL | Freq: Once | INTRAVENOUS | Status: AC
  Administered 2017-06-03: 1000 mL via INTRAVENOUS

## 2017-06-03 MED ORDER — KETOROLAC TROMETHAMINE 30 MG/ML IJ SOLN
30.0000 mg | Freq: Once | INTRAMUSCULAR | Status: AC
  Administered 2017-06-03: 30 mg via INTRAVENOUS
  Filled 2017-06-03: qty 1

## 2017-06-03 NOTE — ED Provider Notes (Signed)
[facility]-EMERGENCY DEPT Provider Note   CSN: [id number] Arrival date & time: 06/03/17  1616     History   Chief Complaint Chief Complaint  Patient presents with  . Generalized Body Aches    HPI Ralph Harris is a 21 y.o. male.  HPI  Pt presenting with fever and body aches- he states that fever began 1 week ago.  He has genearlized malaise and body aches.  He denies sore throat, no cough or difficulty breathing- has contusion of left chest wall from fall several weeks ago- had cxr 3 days ago in the ED which showed clear lungs, no rib fx no ptx.  Pt states he has had some nausea and vomiting and has had decreased appetite associated with the fever.  Denies dysuria.  No abdominal pain.  No cough.  There are no other associated systemic symptoms, there are no other alleviating or modifying factors.   He last took 800mg  ibuprofen at 2pm today.  There are no other associated systemic symptoms, there are no other alleviating or modifying factors.   Past Medical History:  Diagnosis Date  . Multiple allergies     There are no active problems to display for this patient.   Past Surgical History:  Procedure Laterality Date  . FOOT SURGERY         Home Medications    Prior to Admission medications   Medication Sig Start Date End Date Taking? Authorizing Provider  ibuprofen (ADVIL,MOTRIN) 800 MG tablet Take 1 tablet (800 mg total) by mouth every 8 (eight) hours as needed. 08/22/16  Yes Lawyer, Cristal Deerhristopher, PA-C  traMADol (ULTRAM) 50 MG tablet Take 1 tablet (50 mg total) by mouth every 6 (six) hours as needed. 05/31/17  Yes Delo, Riley Lamouglas, MD  HYDROcodone-acetaminophen (NORCO/VICODIN) 5-325 MG tablet Take 1 tablet by mouth every 6 (six) hours as needed for moderate pain. 08/21/16   Lawyer, Cristal Deerhristopher, PA-C  phenol (CHLORASEPTIC) 1.4 % LIQD Use as directed 1 spray in the mouth or throat as needed for throat irritation / pain. 02/04/15   Hess, Nada Boozerobyn M, PA-C    Family History History  reviewed. No pertinent family history.  Social History Social History  Substance Use Topics  . Smoking status: Current Every Day Smoker    Packs/day: 0.50    Types: Cigarettes  . Smokeless tobacco: Former NeurosurgeonUser  . Alcohol use No     Allergies   Patient has no known allergies.   Review of Systems Review of Systems  ROS reviewed and all otherwise negative except for mentioned in HPI  Physical Exam Updated Vital Signs BP 123/72 (BP Location: Right Arm)   Pulse 100   Temp 98.1 F (36.7 C) (Oral)   Resp 18   Ht 5\' 11"  (1.803 m)   Wt 63.5 kg (140 lb)   SpO2 100%   BMI 19.53 kg/m  Vitals reviewed Physical Exam Physical Examination: General appearance - alert, well appearing, and in no distress Mental status - alert, oriented to person, place, and time Eyes - no conjunctival injection, no scleral icterus Mouth - mucous membranes moist, pharynx normal without lesions Neck - supple, no significant adenopathy Chest - clear to auscultation, no wheezes, rales or rhonchi, symmetric air entry Heart - normal rate, regular rhythm, normal S1, S2, no murmurs, rubs, clicks or gallops Abdomen - soft, nontender, nondistended, no masses or organomegaly Neurological - alert, oriented, normal speech Extremities - peripheral pulses normal, no pedal edema, no clubbing or cyanosis Skin - normal coloration and  turgor, no rashes  ED Treatments / Results  Labs (all labs ordered are listed, but only abnormal results are displayed) Labs Reviewed  CBC WITH DIFFERENTIAL/PLATELET - Abnormal; Notable for the following:       Result Value   Hemoglobin 12.5 (*)    HCT 35.3 (*)    Lymphs Abs 0.6 (*)    All other components within normal limits  BASIC METABOLIC PANEL - Abnormal; Notable for the following:    Sodium 131 (*)    Chloride 94 (*)    Glucose, Bld 121 (*)    Calcium 8.7 (*)    All other components within normal limits  URINALYSIS, ROUTINE W REFLEX MICROSCOPIC - Abnormal; Notable for  the following:    Color, Urine AMBER (*)    Bilirubin Urine SMALL (*)    Protein, ur 30 (*)    All other components within normal limits  URINALYSIS, MICROSCOPIC (REFLEX) - Abnormal; Notable for the following:    Bacteria, UA FEW (*)    Squamous Epithelial / LPF 0-5 (*)    All other components within normal limits  MONONUCLEOSIS SCREEN    EKG  EKG Interpretation None       Radiology No results found.  Procedures Procedures (including critical care time)  Medications Ordered in ED Medications  acetaminophen (TYLENOL) tablet 650 mg (650 mg Oral Given 06/03/17 1701)  sodium chloride 0.9 % bolus 1,000 mL (0 mLs Intravenous Stopped 06/03/17 1819)  ketorolac (TORADOL) 30 MG/ML injection 30 mg (30 mg Intravenous Given 06/03/17 1713)  sodium chloride 0.9 % bolus 1,000 mL (0 mLs Intravenous Stopped 06/03/17 1938)     Initial Impression / Assessment and Plan / ED Course  I have reviewed the triage vital signs and the nursing notes.  Pertinent labs & imaging results that were available during my care of the patient were reviewed by me and considered in my medical decision making (see chart for details).     Pt presenting with c/o fever, generalized body aches.  Some vomiting associated.  Labs including monospot are negative.  No rash or joint pain or tick exposure to suggest rmsf or lyme.  Pt feels improved after IV fluids and toradol, he is eating and drinking at the time of discharge.  Suspect viral infection.  Discharged with strict return precautions.  Pt agreeable with plan.  Final Clinical Impressions(s) / ED Diagnoses   Final diagnoses:  Febrile illness    New Prescriptions Discharge Medication List as of 06/03/2017  7:42 PM       Jerelyn Scott, MD 06/05/17 [phone]

## 2017-06-03 NOTE — Discharge Instructions (Signed)
Return to the ED with any concerns including difficulty breathing, vomiting and not able to keep down liquids, abdominal pain especially if it localizes to the right lower abdomen, decreased level of alertness/lethargy, or any other alarming symptoms

## 2017-06-03 NOTE — ED Triage Notes (Signed)
Pt c/o generalized body aches x 3 days  

## 2017-06-03 NOTE — ED Notes (Signed)
ED Provider at bedside. 

## 2017-09-15 IMAGING — CR DG RIBS W/ CHEST 3+V*L*
3 series · 3 of 3 positions shown · non-contrast
Comparison: Chest radiograph October 24, 2006

CLINICAL DATA: Pain following fall 1 week prior

EXAM:
LEFT RIBS AND CHEST - 3+ VIEW

[w chest pa]
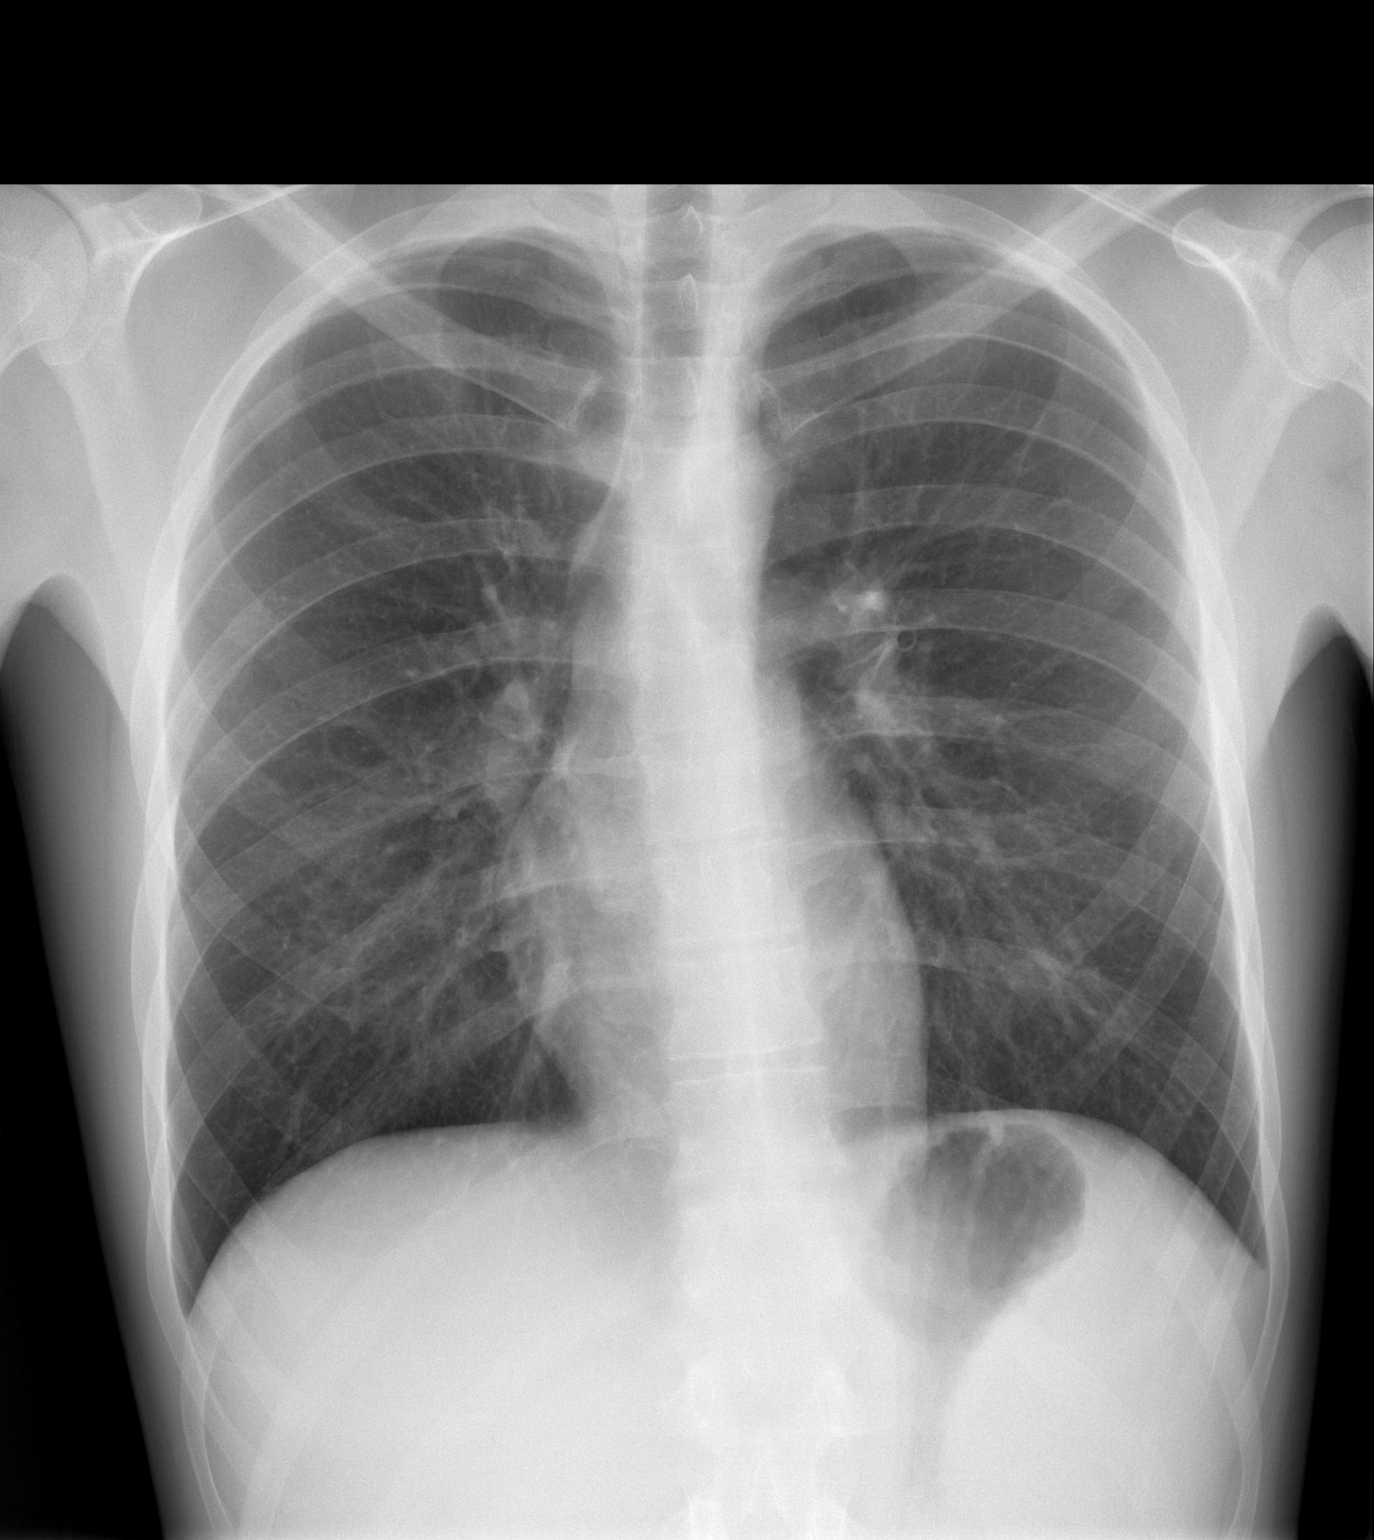

[w ribs ap/pa upper left *]
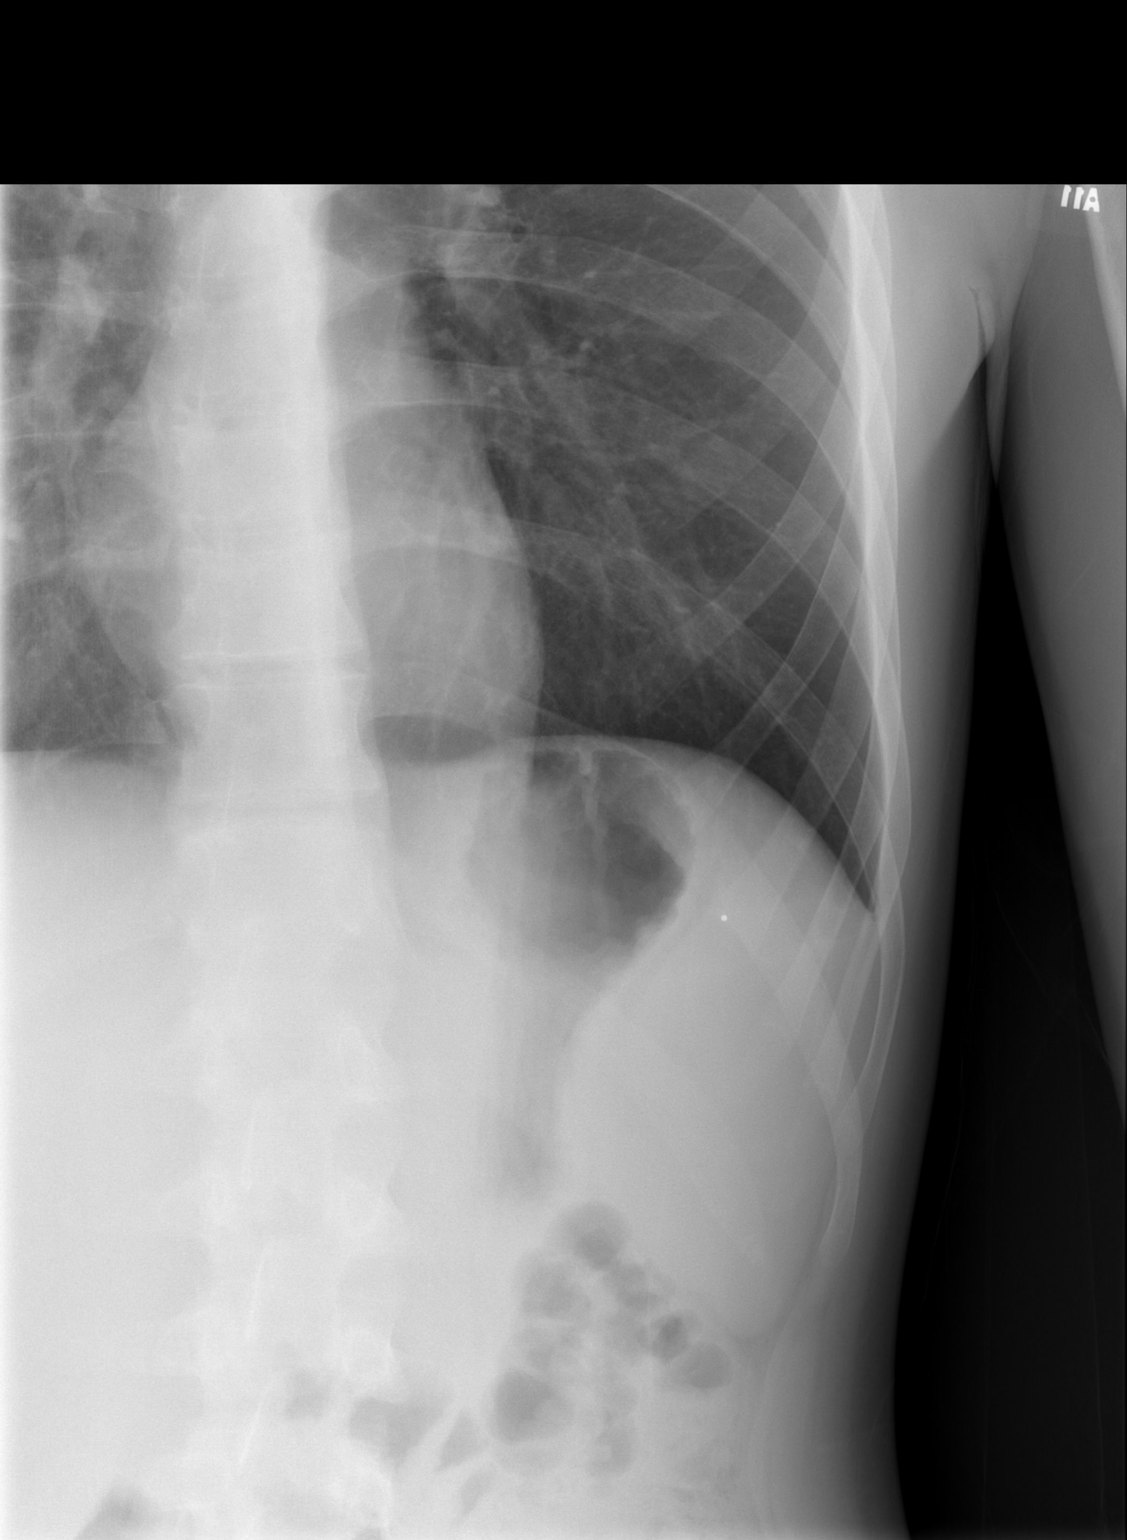

[w ribs oblique left *]
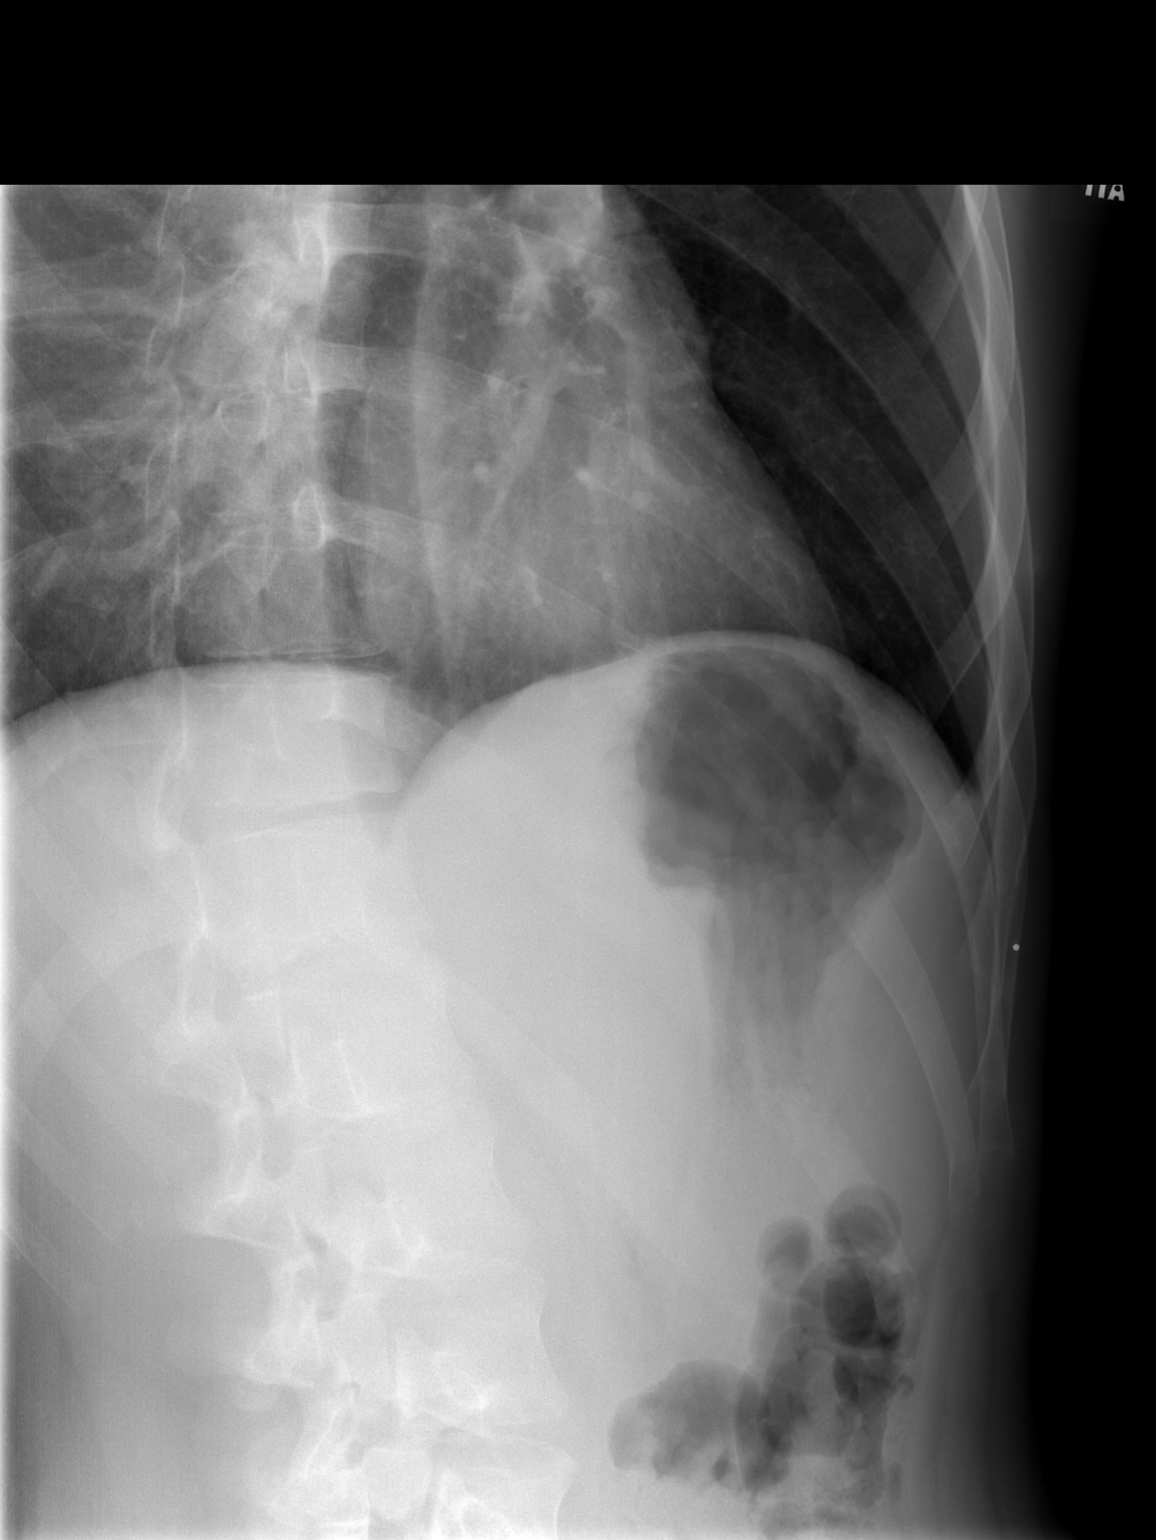

[3 of 3 positions shown; findings below may reference images not displayed]

FINDINGS: Frontal chest as well as oblique and cone-down lower rib images were
obtained. Lungs are clear. Heart size and pulmonary vascularity are
normal. No adenopathy. There is no apparent pneumothorax or pleural
effusion. No rib fracture. There is slight midthoracic
dextroscoliosis.
IMPRESSION: Slight scoliosis. No evident fracture. No pneumothorax. Lungs clear.

## 2018-09-17 ENCOUNTER — Other Ambulatory Visit: Payer: Self-pay

## 2018-09-17 ENCOUNTER — Emergency Department (HOSPITAL_COMMUNITY)
Admission: EM | Admit: 2018-09-17 | Discharge: 2018-09-17 | Disposition: A | Discharge status: Home / Self Care | Payer: Self-pay | Attending: Emergency Medicine | Admitting: Emergency Medicine

## 2018-09-17 ENCOUNTER — Encounter (HOSPITAL_COMMUNITY): Payer: Self-pay | Admitting: Emergency Medicine

## 2018-09-17 DIAGNOSIS — T402X1A Poisoning by other opioids, accidental (unintentional), initial encounter: Secondary | ICD-10-CM | POA: Insufficient documentation

## 2018-09-17 DIAGNOSIS — T40601A Poisoning by unspecified narcotics, accidental (unintentional), initial encounter: Secondary | ICD-10-CM

## 2018-09-17 DIAGNOSIS — F17210 Nicotine dependence, cigarettes, uncomplicated: Secondary | ICD-10-CM | POA: Insufficient documentation

## 2018-09-17 HISTORY — DX: Endocarditis, valve unspecified: I38

## 2018-09-17 HISTORY — DX: Personal history of pulmonary embolism: Z86.711

## 2018-09-17 HISTORY — DX: Other psychoactive substance use, unspecified, uncomplicated: F19.90

## 2018-09-17 LAB — I-STAT CHEM 8, ED
BUN: 30 mg/dL — ABNORMAL HIGH (ref 6–20)
Calcium, Ion: 1.12 mmol/L — ABNORMAL LOW (ref 1.15–1.40)
Chloride: 99 mmol/L (ref 98–111)
Creatinine, Ser: 1.1 mg/dL (ref 0.61–1.24)
GLUCOSE: 106 mg/dL — AB (ref 70–99)
HEMATOCRIT: 33 % — AB (ref 39.0–52.0)
Hemoglobin: 11.2 g/dL — ABNORMAL LOW (ref 13.0–17.0)
Potassium: 3.9 mmol/L (ref 3.5–5.1)
Sodium: 134 mmol/L — ABNORMAL LOW (ref 135–145)
TCO2: 25 mmol/L (ref 22–32)

## 2018-09-17 LAB — CBG MONITORING, ED: GLUCOSE-CAPILLARY: 81 mg/dL (ref 70–99)

## 2018-09-17 MED ORDER — ONDANSETRON HCL 4 MG/2ML IJ SOLN
4.0000 mg | Freq: Once | INTRAMUSCULAR | Status: AC
  Administered 2018-09-17: 4 mg via INTRAVENOUS
  Filled 2018-09-17: qty 2

## 2018-09-17 NOTE — ED Notes (Signed)
Pt was given graham crackers and peanut butter. 

## 2018-09-17 NOTE — ED Notes (Signed)
ED Provider at bedside. 

## 2018-09-17 NOTE — ED Notes (Signed)
Pt given a sprite 

## 2018-09-17 NOTE — ED Notes (Signed)
Patient verbalized understanding of dc papers, given bus pass and soda upon discharge, per his request. Pt alert and oriented, VSS, ambulatory with steady gait out to ED lobby.

## 2018-09-17 NOTE — ED Provider Notes (Signed)
[facility] EMERGENCY DEPARTMENT Provider Note   CSN: [id number] Arrival date & time: 09/17/18  0146    History   Chief Complaint No chief complaint on file.   HPI Ralph Harris is a 22 y.o. male.   21 year old male with a history of IV drug abuse, PE, and endocarditis status post tricuspid valve replacement presents to the ED for overdose.  Patient states that he used approximately 100 mg oxycodone over a period of 5 hours.  Reports that his last use was around midnight.  He crushed and snorted this medication.  He is complaining of some discomfort to his lungs.  Otherwise no complaints of pain.  He is also noting some nausea.  He has not had any vomiting.  Was given Narcan prior to arrival.  The history is provided by the patient. No language interpreter was used.    Past Medical History:  Diagnosis Date  . Endocarditis   . History of pulmonary embolus (PE)   . IV drug user   . Multiple allergies     There are no active problems to display for this patient.   Past Surgical History:  Procedure Laterality Date  . FOOT SURGERY    . TRICUSPID VALVE REPLACEMENT     failed at [facility]         Home Medications    Prior to Admission medications   Medication Sig Start Date End Date Taking? Authorizing Provider  Buprenorphine HCl-Naloxone HCl 8-2 MG FILM Place 1.5 Film under the tongue 2 (two) times daily. 09/15/18  Yes [provider]  ibuprofen (ADVIL,MOTRIN) 800 MG tablet Take 1 tablet (800 mg total) by mouth every 8 (eight) hours as needed. Patient not taking: Reported on 09/17/2018 08/22/16   Lawyer, Cristal Deer, PA-C  phenol (CHLORASEPTIC) 1.4 % LIQD Use as directed 1 spray in the mouth or throat as needed for throat irritation / pain. Patient not taking: Reported on 09/17/2018 02/04/15   Kathrynn Speed, PA-C    Family History No family history on file.  Social History Social History   Tobacco Use  . Smoking status: Current Every Day  Smoker    Packs/day: 0.50    Types: Cigarettes  . Smokeless tobacco: Former Engineer, water Use Topics  . Alcohol use: No  . Drug use: Yes    Types: IV     Allergies   Patient has no known allergies.   Review of Systems Review of Systems Ten systems reviewed and are negative for acute change, except as noted in the HPI.    Physical Exam Updated Vital Signs BP (!) 147/74   Pulse (!) 103   Temp 98.3 F (36.8 C)   Resp 16   SpO2 96%   Physical Exam  Constitutional: He is oriented to person, place, and time. He appears well-developed and well-nourished. No distress.  Nontoxic appearing.  HENT:  Head: Normocephalic and atraumatic.  Eyes: Conjunctivae and EOM are normal. No scleral icterus.  Neck: Normal range of motion.  Cardiovascular: Regular rhythm and intact distal pulses.  tachycardia  Pulmonary/Chest: Effort normal. No stridor. No respiratory distress. He has no wheezes.  Respirations even and unlabored. Lungs CTAB. Sternotomy scar.  Musculoskeletal: Normal range of motion.  Neurological: He is alert and oriented to person, place, and time. He exhibits normal muscle tone. Coordination normal.  GCS 15.  Answers questions appropriately and follows commands.  Skin: Skin is warm and dry. No rash noted. He is not diaphoretic. No erythema.  No pallor.  Psychiatric: He has a normal mood and affect. His behavior is normal.  Nursing note and vitals reviewed.    ED Treatments / Results  Labs (all labs ordered are listed, but only abnormal results are displayed) Labs Reviewed  I-STAT CHEM 8, ED - Abnormal; Notable for the following components:      Result Value   Sodium 134 (*)    BUN 30 (*)    Glucose, Bld 106 (*)    Calcium, Ion 1.12 (*)    Hemoglobin 11.2 (*)    HCT 33.0 (*)    All other components within normal limits  CBG MONITORING, ED  I-STAT TROPONIN, ED    EKG None  Radiology No results found.  Procedures Procedures (including critical care  time)  Medications Ordered in ED Medications  ondansetron (ZOFRAN) injection 4 mg (4 mg Intravenous Given 09/17/18 0255)     Initial Impression / Assessment and Plan / ED Course  I have reviewed the triage vital signs and the nursing notes.  Pertinent labs & imaging results that were available during my care of the patient were reviewed by me and considered in my medical decision making (see chart for details).     22 year old male presents to the emergency department following an opioid overdose.  During my assessment, the patient is satting 97% on room air.  He has complaints of nausea.  This improved following administration of Zofran.  He is now tolerating Sprite and graham crackers with peanut butter.  He has a history of endocarditis with tricuspid valve repair.  A troponin was drawn which was negative.  Chemistry panel also reassuring.  The patient has been observed in the emergency department for over 2 hours without clinical decompensation.  His tachycardia is improving.  I do not believe further emergent work-up is indicated at this time.  He has been instructed to discontinue use of opiates.  Patient given resource guide should he desire outpatient counseling.  Return precautions discussed and provided. Patient discharged in stable condition with no unaddressed concerns.   Final Clinical Impressions(s) / ED Diagnoses   Final diagnoses:  Opiate overdose, accidental or unintentional, initial encounter [facility])    ED Discharge Orders    None       Ralph Madura, PA-C 09/17/18 0418    Eber Hong, MD 09/17/18 [phone]

## 2018-09-17 NOTE — ED Triage Notes (Signed)
See downtime record

## 2018-09-17 NOTE — ED Notes (Signed)
Pt in two separate bathrooms for extended amount of time, came out with "urine sample" that was clear and cold to the touch and stated that he needed to go to another one because "the sink didn't work in the bathroom." IV removed as a precaution.

## 2018-09-17 NOTE — ED Notes (Signed)
Pt has had two glasses of coke and a glass of juice without vomiting.

## 2018-09-18 LAB — CBG MONITORING, ED: Glucose-Capillary: 69 mg/dL — ABNORMAL LOW (ref 70–99)

## 2018-09-18 LAB — I-STAT TROPONIN, ED: TROPONIN I, POC: 0 ng/mL (ref 0.00–0.08)

## 2022-10-27 DIAGNOSIS — M79605 Pain in left leg: Secondary | ICD-10-CM | POA: Diagnosis not present

## 2022-10-27 DIAGNOSIS — M79604 Pain in right leg: Secondary | ICD-10-CM | POA: Diagnosis not present

## 2023-05-26 DIAGNOSIS — Z881 Allergy status to other antibiotic agents status: Secondary | ICD-10-CM | POA: Diagnosis not present

## 2023-05-26 DIAGNOSIS — K0889 Other specified disorders of teeth and supporting structures: Secondary | ICD-10-CM | POA: Diagnosis not present

## 2023-05-26 DIAGNOSIS — I38 Endocarditis, valve unspecified: Secondary | ICD-10-CM | POA: Diagnosis not present

## 2023-05-26 DIAGNOSIS — L03211 Cellulitis of face: Secondary | ICD-10-CM | POA: Diagnosis not present

## 2023-05-26 DIAGNOSIS — K047 Periapical abscess without sinus: Secondary | ICD-10-CM | POA: Diagnosis not present

## 2023-05-26 DIAGNOSIS — R220 Localized swelling, mass and lump, head: Secondary | ICD-10-CM | POA: Diagnosis not present

## 2023-05-27 DIAGNOSIS — L03211 Cellulitis of face: Secondary | ICD-10-CM | POA: Diagnosis not present

## 2023-05-27 DIAGNOSIS — R220 Localized swelling, mass and lump, head: Secondary | ICD-10-CM | POA: Diagnosis not present

## 2023-05-27 DIAGNOSIS — K047 Periapical abscess without sinus: Secondary | ICD-10-CM | POA: Diagnosis not present

## 2024-09-05 DIAGNOSIS — F1910 Other psychoactive substance abuse, uncomplicated: Secondary | ICD-10-CM | POA: Diagnosis not present

## 2024-09-05 DIAGNOSIS — K047 Periapical abscess without sinus: Secondary | ICD-10-CM | POA: Diagnosis not present
